# Patient Record
Sex: Male | Born: 1980 | Race: Black or African American | Hispanic: No | Marital: Single | Smoking: Never smoker
Health system: Southern US, Community
[De-identification: ages and names within clinical notes are randomized; demographics above are authoritative.]

---
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
## 2003-01-17 ENCOUNTER — Ambulatory Visit (HOSPITAL_COMMUNITY): Admission: RE | Admit: 2003-01-17 | Discharge: 2003-01-17 | Payer: Self-pay | Admitting: Family Medicine

## 2003-08-23 ENCOUNTER — Ambulatory Visit (HOSPITAL_COMMUNITY): Admission: RE | Admit: 2003-08-23 | Discharge: 2003-08-23 | Payer: Self-pay | Admitting: Family Medicine

## 2003-09-09 ENCOUNTER — Encounter (INDEPENDENT_AMBULATORY_CARE_PROVIDER_SITE_OTHER): Payer: Self-pay | Admitting: *Deleted

## 2003-09-09 ENCOUNTER — Ambulatory Visit (HOSPITAL_COMMUNITY): Admission: RE | Admit: 2003-09-09 | Discharge: 2003-09-09 | Payer: Self-pay | Admitting: Internal Medicine

## 2003-11-19 ENCOUNTER — Ambulatory Visit: Payer: Self-pay | Admitting: Family Medicine

## 2003-11-19 ENCOUNTER — Ambulatory Visit: Payer: Self-pay | Admitting: *Deleted

## 2004-01-24 ENCOUNTER — Ambulatory Visit: Payer: Self-pay | Admitting: Family Medicine

## 2004-02-20 ENCOUNTER — Ambulatory Visit: Payer: Self-pay | Admitting: Family Medicine

## 2004-03-03 ENCOUNTER — Ambulatory Visit (HOSPITAL_COMMUNITY): Admission: RE | Admit: 2004-03-03 | Discharge: 2004-03-03 | Payer: Self-pay | Admitting: Family Medicine

## 2004-12-11 IMAGING — US US BIOPSY
1 series · 9 of 9 positions shown · non-contrast
Comparison: none

CLINICAL DATA: 23 year old male with dominant nodule within the left lobe of the thyroid.  Request to perform fine needle aspiration.
 ULTRASOUND-GUIDED FINE NEEDLE ASPIRATION OF LEFT LOBE OF THYROID ? 09/09/03 
 The procedure was thoroughly discussed with the patient and questions were answered.  Risks and benefits of the procedure were also delineated.  Verbal as well as written consent was obtained.    
 Ultrasound was first performed to mark and localize an adequate site for the biopsy and this area was marked.  The patient was then prepped and draped in the normal sterile fashion and 1% lidocaine was used for local anesthesia.  Using direct ultrasound guidance, three passes were made using a 25 gauge hypodermic needle into the dominant nodule located within the left lobe of the thyroid.  Ultrasound confirmed placement of the needle on all three occasions.  Specimens were given to pathology for further analysis.  Post-procedure imaging demonstrated no immediate complication or hematoma.  
 Dr. Isaura Lala personally supervised the above procedure.  
 IMPRESSION
 Successful ultrasound-guided fine needle aspiration of the left lobe of the thyroid.  Final pathology pending.

[Series 1: unknown · 0.07mm/px · 9 of 9 slices shown]
[im 1/9]
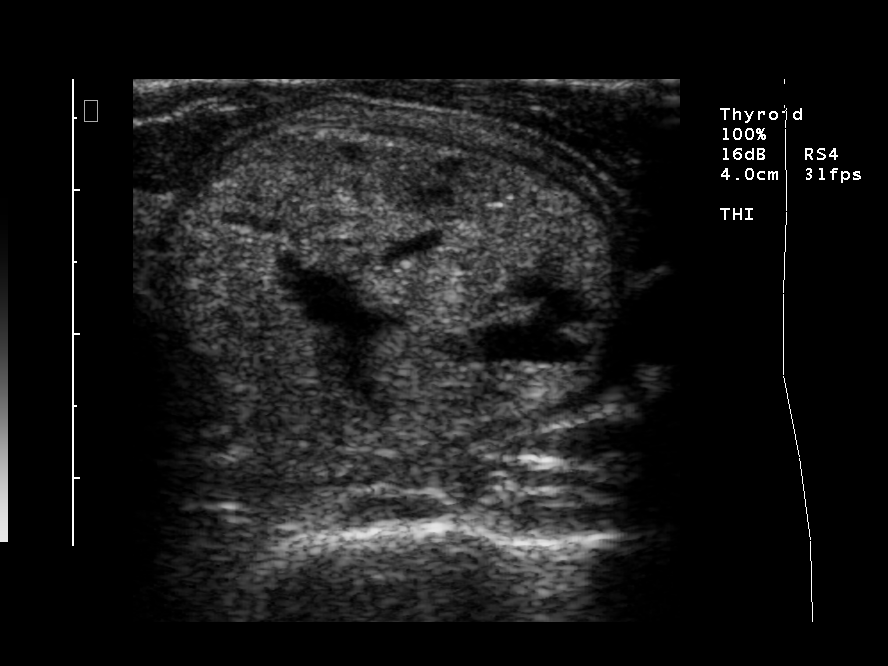
[im 2/9]
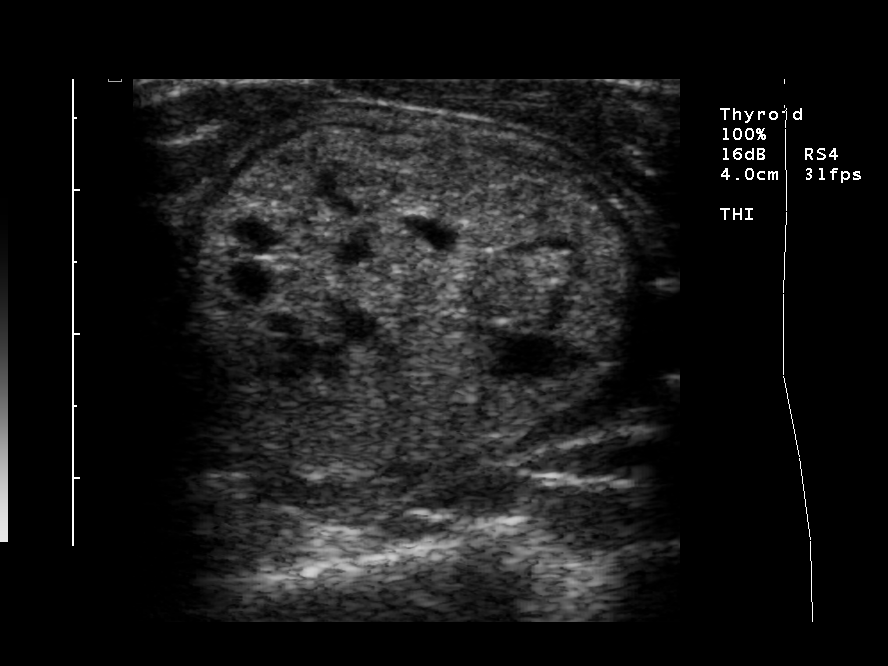
[im 3/9]
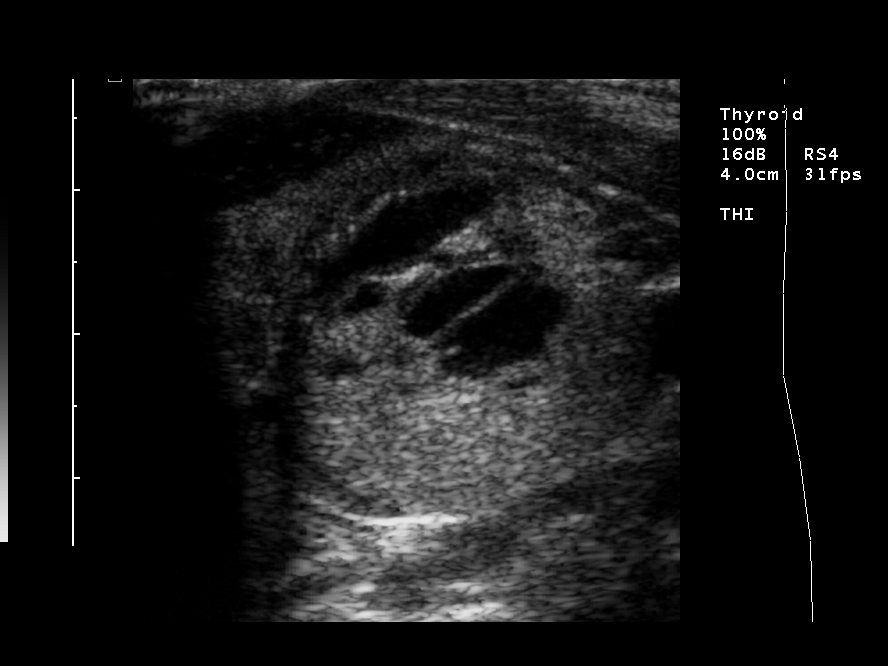
[im 4/9]
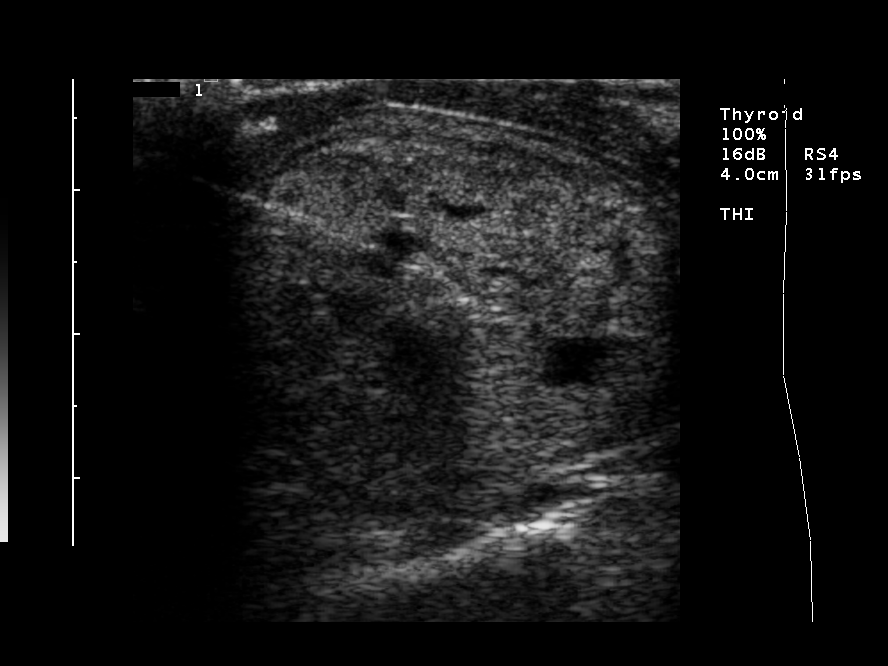
[im 5/9]
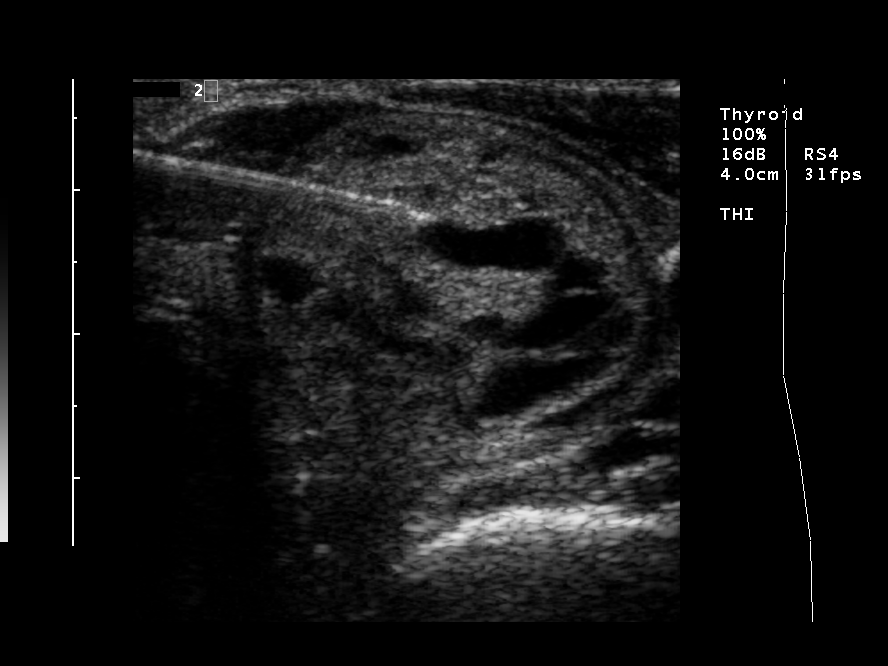
[im 6/9]
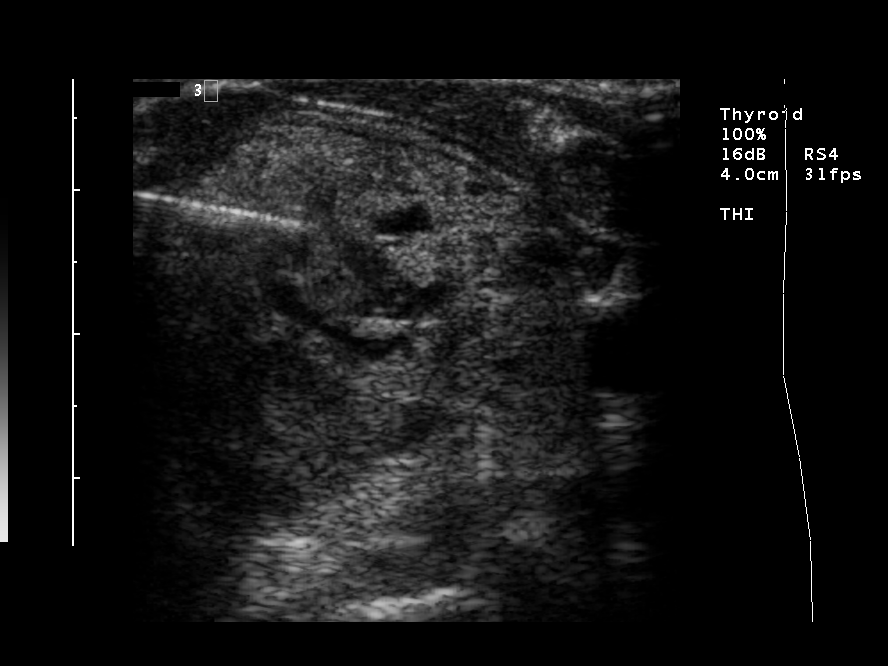
[im 7/9]
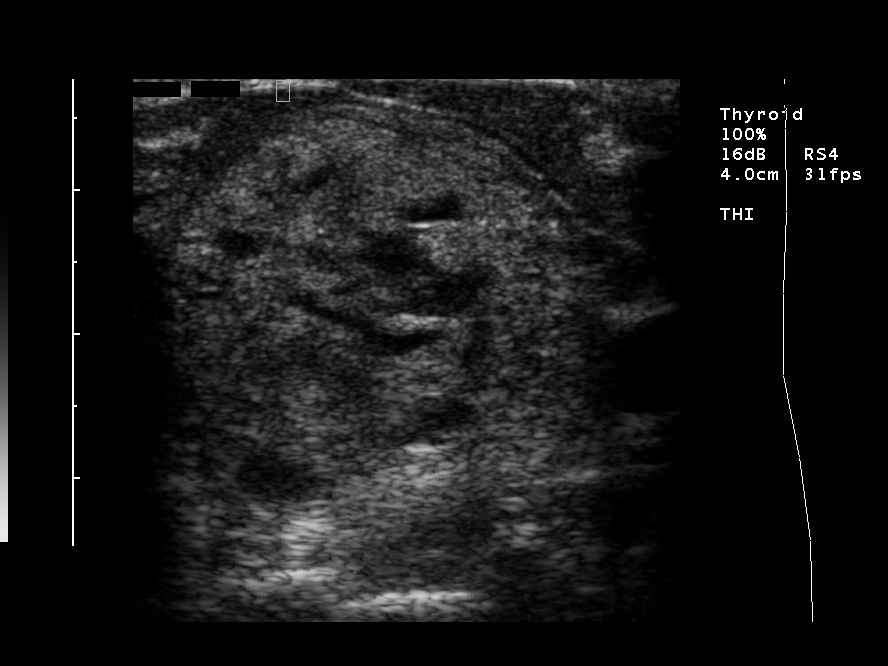
[im 8/9]
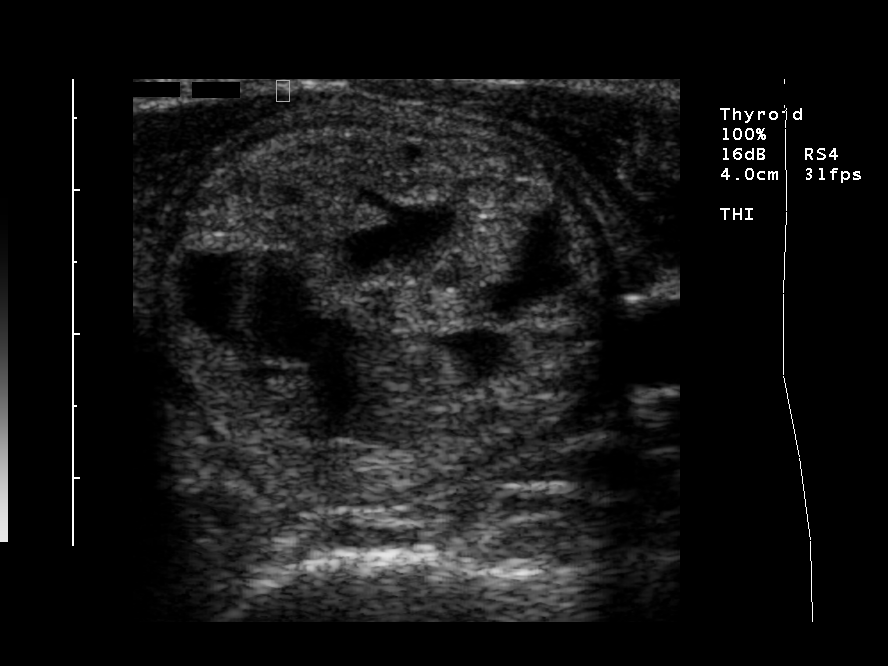
[im 9/9]
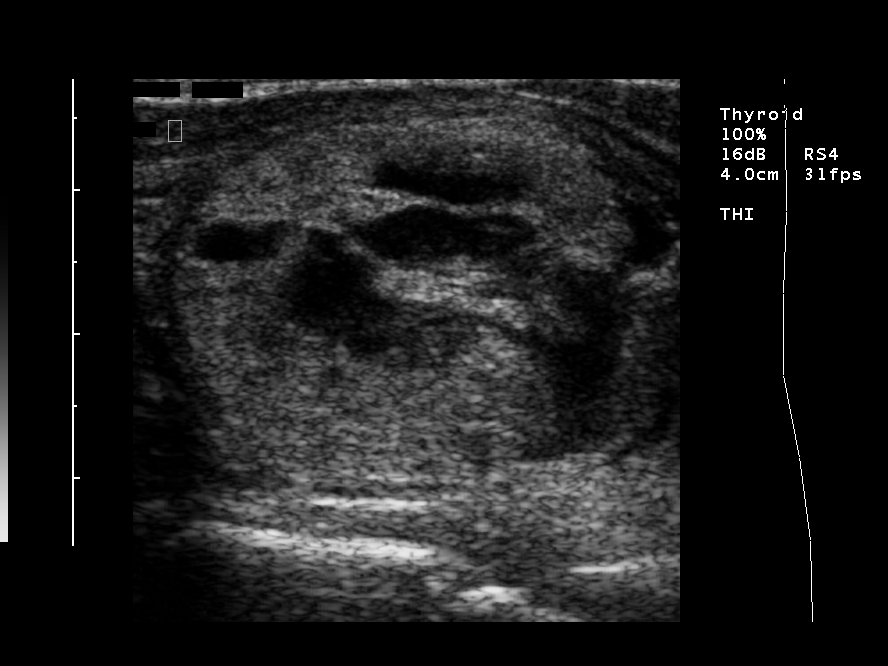

[9 of 9 positions shown; findings below may reference images not displayed]

## 2005-01-09 ENCOUNTER — Emergency Department (HOSPITAL_COMMUNITY): Admission: EM | Admit: 2005-01-09 | Discharge: 2005-01-09 | Payer: Self-pay | Admitting: Family Medicine

## 2005-02-07 ENCOUNTER — Emergency Department (HOSPITAL_COMMUNITY): Admission: EM | Admit: 2005-02-07 | Discharge: 2005-02-07 | Payer: Self-pay | Admitting: Emergency Medicine

## 2005-12-06 ENCOUNTER — Encounter (INDEPENDENT_AMBULATORY_CARE_PROVIDER_SITE_OTHER): Payer: Self-pay | Admitting: Specialist

## 2005-12-06 ENCOUNTER — Encounter: Admission: RE | Admit: 2005-12-06 | Discharge: 2005-12-06 | Payer: Self-pay | Admitting: Surgery

## 2005-12-06 ENCOUNTER — Other Ambulatory Visit: Admission: RE | Admit: 2005-12-06 | Discharge: 2005-12-06 | Payer: Self-pay | Admitting: Interventional Radiology

## 2006-04-18 ENCOUNTER — Emergency Department (HOSPITAL_COMMUNITY): Admission: EM | Admit: 2006-04-18 | Discharge: 2006-04-18 | Payer: Self-pay | Admitting: Emergency Medicine

## 2006-05-21 ENCOUNTER — Emergency Department (HOSPITAL_COMMUNITY): Admission: EM | Admit: 2006-05-21 | Discharge: 2006-05-21 | Payer: Self-pay | Admitting: Family Medicine

## 2007-02-23 ENCOUNTER — Ambulatory Visit: Payer: Self-pay | Admitting: Family Medicine

## 2007-02-23 LAB — CONVERTED CEMR LAB
ALT: 9 units/L (ref 0–53)
Alkaline Phosphatase: 65 units/L (ref 39–117)
BUN: 15 mg/dL (ref 6–23)
Basophils Absolute: 0 10*3/uL (ref 0.0–0.1)
Free T4: 1.72 ng/dL (ref 0.89–1.80)
Hemoglobin: 15.4 g/dL (ref 13.0–17.0)
Lymphocytes Relative: 35 % (ref 12–46)
Lymphs Abs: 2.1 10*3/uL (ref 0.7–4.0)
MCHC: 34.1 g/dL (ref 30.0–36.0)
Monocytes Absolute: 0.6 10*3/uL (ref 0.1–1.0)
Neutro Abs: 3.2 10*3/uL (ref 1.7–7.7)
RBC: 5.06 M/uL (ref 4.22–5.81)
RDW: 11.8 % (ref 11.5–15.5)
Sodium: 139 meq/L (ref 135–145)
T4, Total: 11 ug/dL (ref 5.0–12.5)
Total Bilirubin: 0.4 mg/dL (ref 0.3–1.2)
WBC: 5.9 10*3/uL (ref 4.0–10.5)

## 2007-03-10 IMAGING — US US BIOPSY
1 series · 9 of 9 positions shown · non-contrast
Comparison: none

CLINICAL DATA: Patient with history of multinodular goiter and a dominant mixed cystic and solid left thyroid lobe nodule previously measuring 5.2 x 2.7 x 3.6 cm on 03/03/04 thyroid ultrasound.  The patient has also undergone fine needle aspiration of this dominant nodule in [DATE] with findings suggesting a hyperplastic nodule vs. follicular lesion.  The patient presents today for follow-up imaging and fine needle aspiration of this dominant left thyroid lobe nodule. 
ULTRASOUND GUIDED FINE NEEDLE ASPIRATION, DOMINANT LEFT THYROID SOLID/CYSTIC NODULE:

[Series 1: unknown · 0.09mm/px · 9 of 9 slices shown]
[im 1/9]
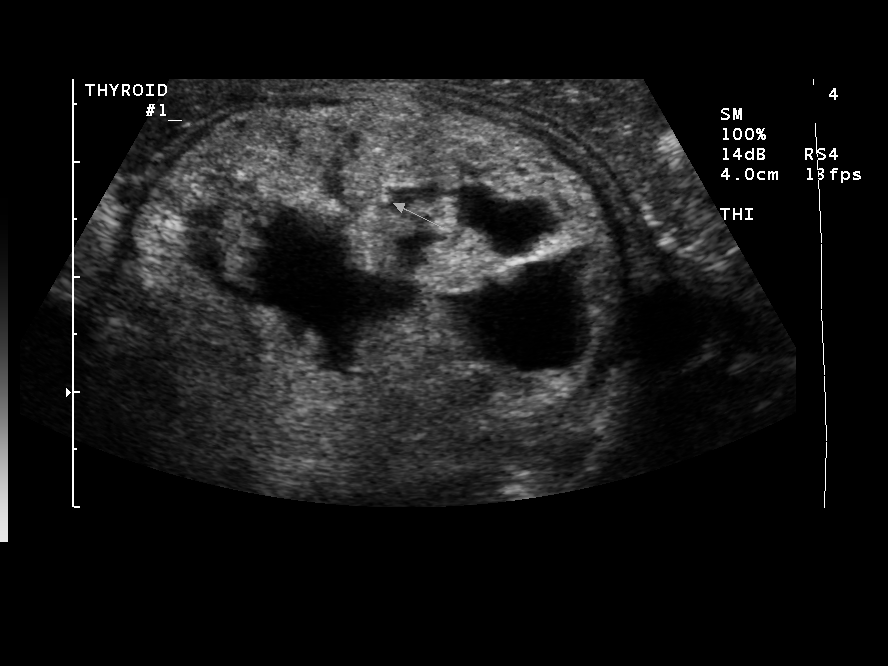
[im 2/9]
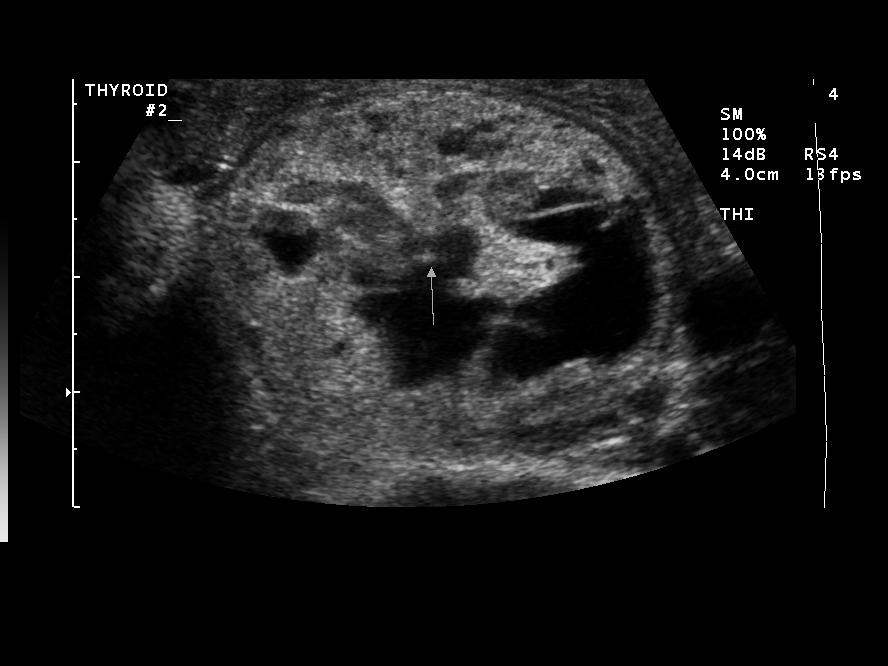
[im 3/9]
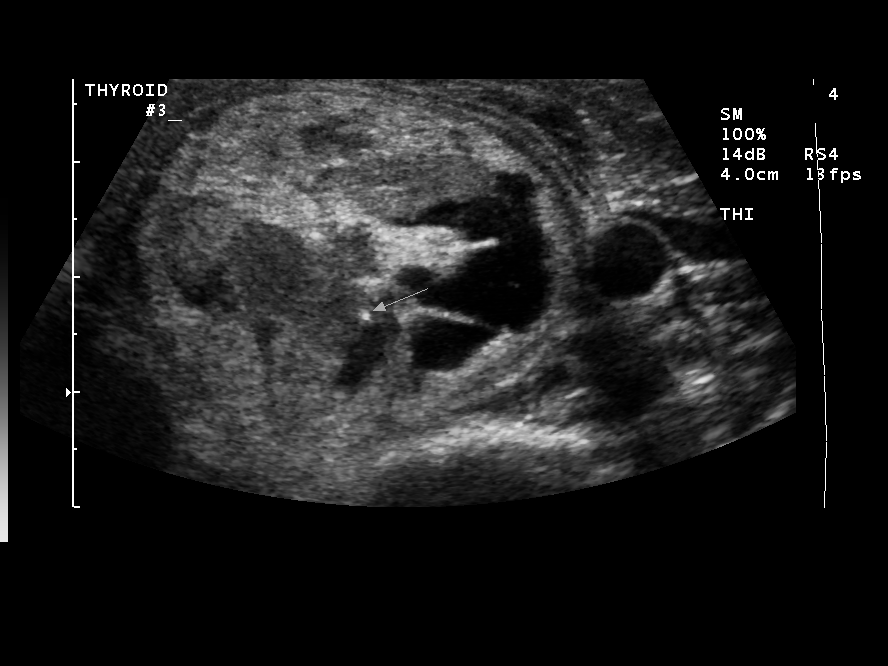
[im 4/9]
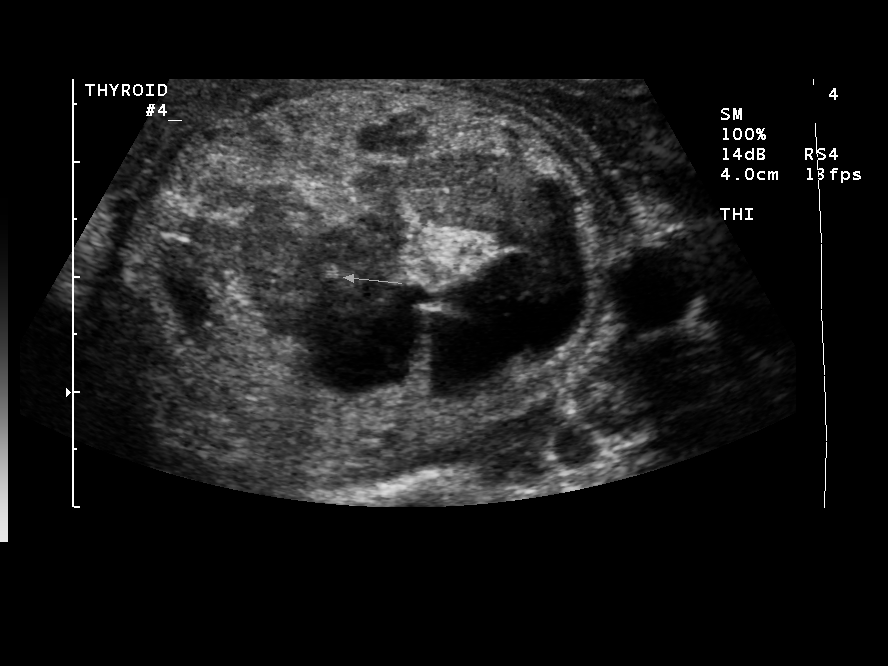
[im 5/9]
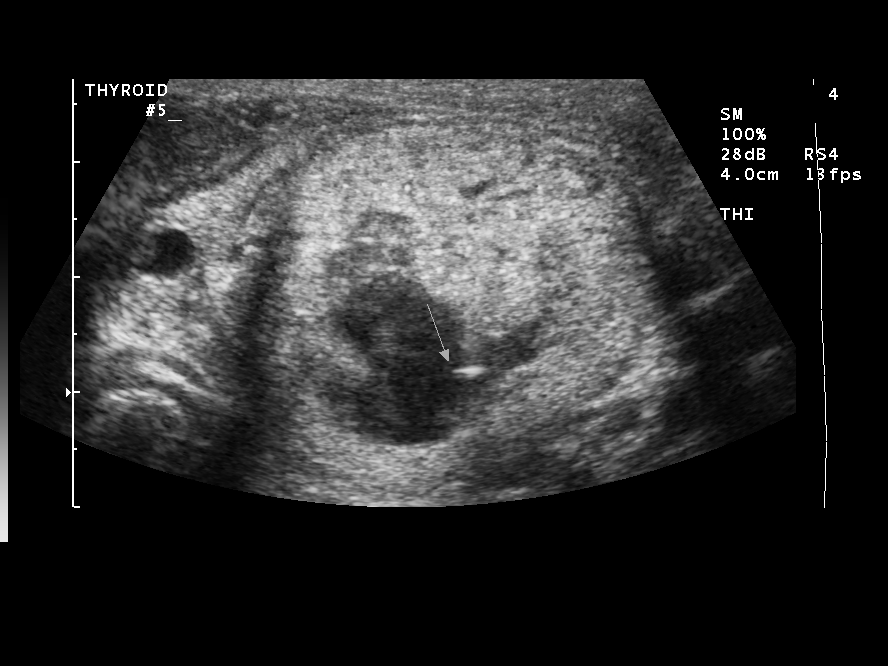
[im 6/9]
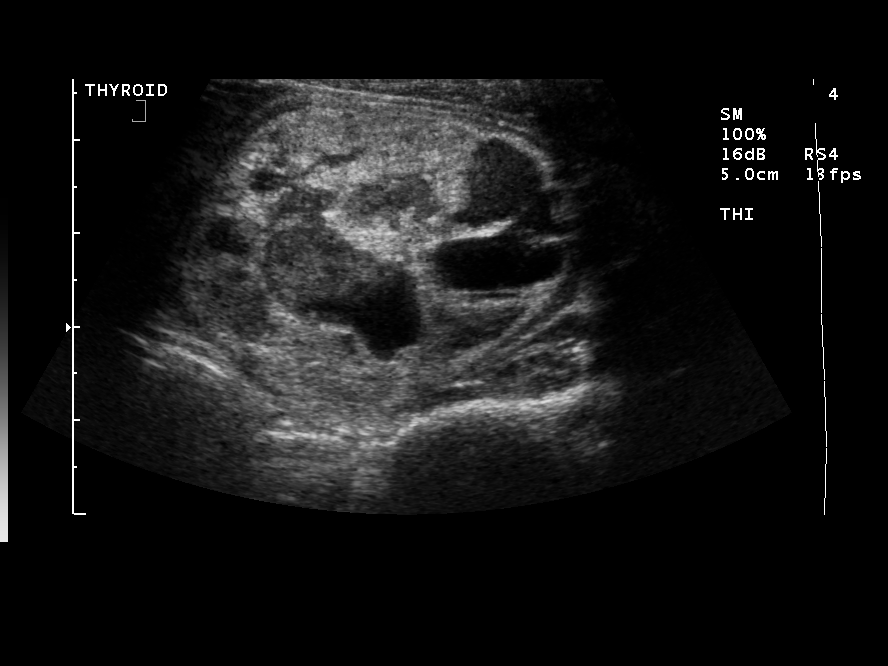
[im 7/9]
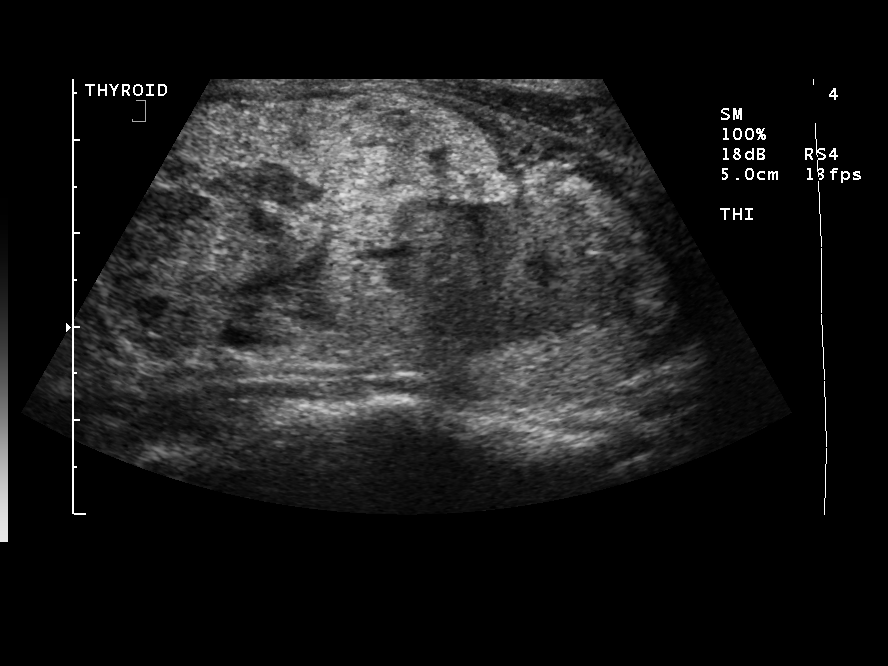
[im 8/9]
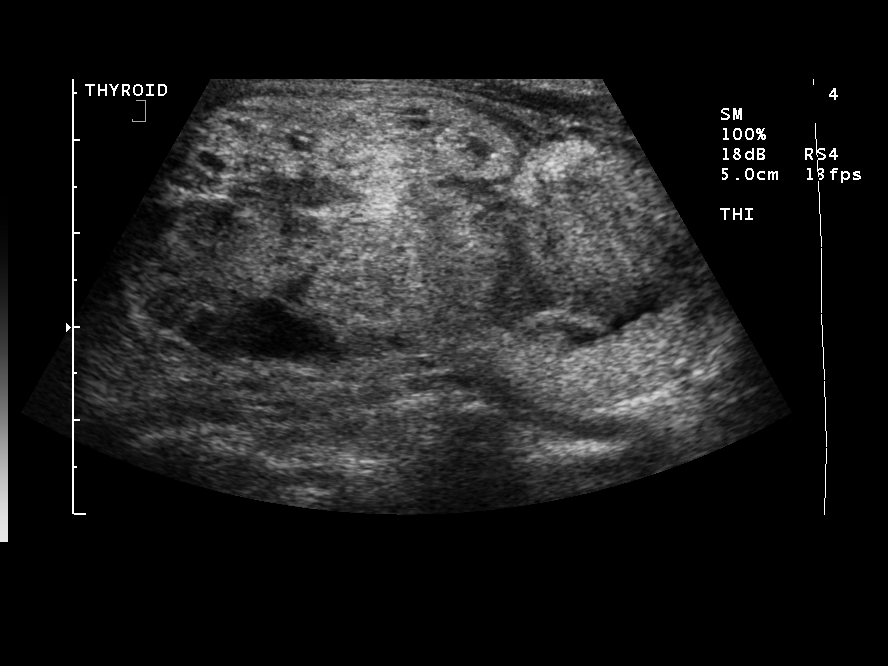
[im 9/9]
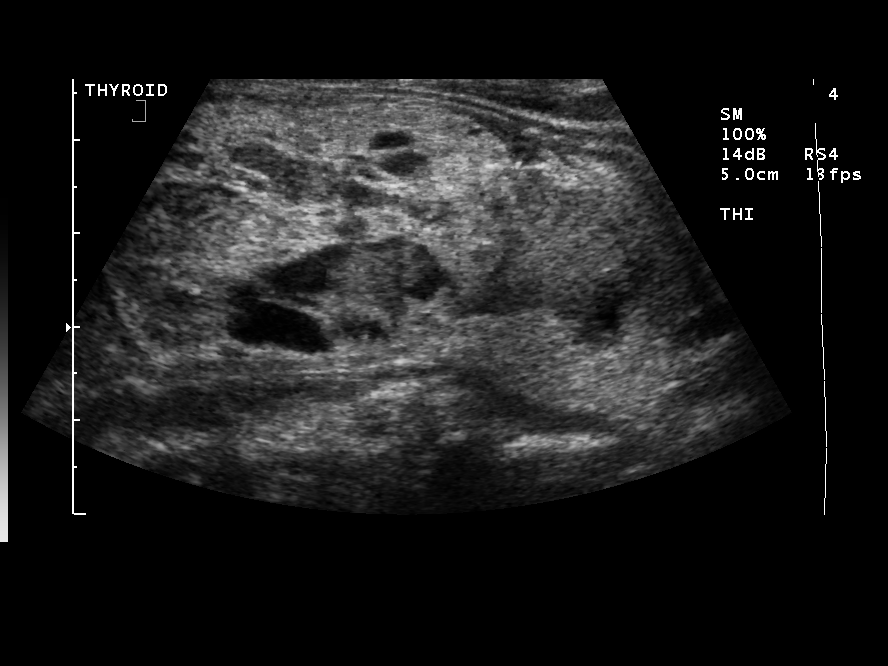

[9 of 9 positions shown; findings below may reference images not displayed]

FINDINGS: The above procedure was thoroughly discussed with the patient and written informed consent was obtained.
Since the patient?s previous thyroid ultrasound of 03/03/04, the dominant mixed solid and cystic nodule involving much of the mid and lower pole of the left thyroid lobe has increased in size, now measuring 6.5 x 3.2 x 4.2 cm (previously 5.2 x 2.7 x 3.6 cm).  
Ultrasound was then performed to localize and mark an adequate site for the biopsy.  The patient was then prepped and draped in a normal sterile fashion, and 1% Lidocaine was used for local anesthesia.  Under direct ultrasound guidance, five passes (three via 25 gauge hypodermic needles, 2 via 22 gauge hypodermic needles) were made into the dominant nodule located within the mid and lower pole of the left lobe of the thyroid.  Ultrasound confirmed placement of the needle on all five occasions.   Approximately 10 cc's of bloody fluid were removed from the dominant cystic component of the nodule.   The specimens were given to pathology for further analysis.  Post procedure imaging demonstrated no hematoma or immediate complication. The patient tolerated the procedure well.
IMPRESSION: Successful ultrasound guided fine needle aspiration, dominant nodule, mid and lower pole, left lobe of the thyroid.  Final pathology pending.

## 2007-06-16 ENCOUNTER — Ambulatory Visit: Payer: Self-pay | Admitting: Internal Medicine

## 2007-06-20 ENCOUNTER — Ambulatory Visit: Payer: Self-pay | Admitting: Internal Medicine

## 2007-10-05 ENCOUNTER — Emergency Department (HOSPITAL_COMMUNITY): Admission: EM | Admit: 2007-10-05 | Discharge: 2007-10-05 | Payer: Self-pay | Admitting: Family Medicine

## 2013-04-24 ENCOUNTER — Other Ambulatory Visit: Payer: Self-pay | Admitting: *Deleted

## 2013-05-01 ENCOUNTER — Telehealth: Payer: Self-pay | Admitting: *Deleted

## 2013-05-01 NOTE — Telephone Encounter (Signed)
Okay to refill but need to make his appointment for July for followup with labs

## 2013-05-01 NOTE — Telephone Encounter (Signed)
Patient said he saw you over a year ago about his thyroid, he's on Synthroid 112 mcg, he said at his last visit you told him you didn't need to see him again for about 2 years.  He needs a refill of this medication.   He is going to come in Friday to sign a medical release form to get his records from [facility], okay to send in a refill now?

## 2013-05-02 ENCOUNTER — Other Ambulatory Visit: Payer: Self-pay | Admitting: *Deleted

## 2013-05-02 MED ORDER — SYNTHROID 112 MCG PO TABS: 112.0000 ug | ORAL_TABLET | Freq: Every day | ORAL | Status: DC

## 2013-06-12 ENCOUNTER — Other Ambulatory Visit: Payer: Self-pay

## 2013-06-12 ENCOUNTER — Other Ambulatory Visit: Payer: Self-pay | Admitting: *Deleted

## 2013-06-12 DIAGNOSIS — E039 Hypothyroidism, unspecified: Secondary | ICD-10-CM | POA: Insufficient documentation

## 2013-06-14 ENCOUNTER — Ambulatory Visit: Payer: Self-pay | Admitting: Endocrinology

## 2013-06-14 ENCOUNTER — Other Ambulatory Visit (INDEPENDENT_AMBULATORY_CARE_PROVIDER_SITE_OTHER): Payer: Self-pay

## 2013-06-14 DIAGNOSIS — E039 Hypothyroidism, unspecified: Secondary | ICD-10-CM

## 2013-06-14 LAB — TSH: TSH: 0.8 u[IU]/mL (ref 0.35–4.50)

## 2013-06-14 LAB — T4, FREE: Free T4: 1.17 ng/dL (ref 0.60–1.60)

## 2013-07-11 ENCOUNTER — Ambulatory Visit: Payer: Self-pay | Admitting: Endocrinology

## 2013-07-20 ENCOUNTER — Ambulatory Visit: Payer: Self-pay | Admitting: Endocrinology

## 2013-10-10 ENCOUNTER — Telehealth: Payer: Self-pay | Admitting: Endocrinology

## 2013-10-10 NOTE — Telephone Encounter (Signed)
Pt wants Bradley Paul to call pt. He does not want to have his labs drawn his level has never changed at his appt on 11/08/13 please discuss with Dr. Lucianne Muss and let him know if Dr. Lucianne Muss is ok with that

## 2013-10-10 NOTE — Telephone Encounter (Signed)
Please see below and advise.

## 2013-10-10 NOTE — Telephone Encounter (Signed)
Did not understand what he is saying. If he wants to do the labs on the day of the visit that is okay

## 2013-10-16 ENCOUNTER — Other Ambulatory Visit: Payer: Self-pay | Admitting: Endocrinology

## 2013-11-08 ENCOUNTER — Ambulatory Visit (INDEPENDENT_AMBULATORY_CARE_PROVIDER_SITE_OTHER): Payer: Self-pay | Admitting: Endocrinology

## 2013-11-08 ENCOUNTER — Encounter: Payer: Self-pay | Admitting: Endocrinology

## 2013-11-08 VITALS — BP 115/75 | HR 60 | Temp 97.6°F | Resp 14 | Ht 69.0 in | Wt 149.6 lb

## 2013-11-08 DIAGNOSIS — E890 Postprocedural hypothyroidism: Secondary | ICD-10-CM

## 2013-11-08 NOTE — Progress Notes (Signed)
Patient ID: Bradley EllisAhmed M Sheikh-Issa, male   DOB: 03/29/1980, 33 y.o.   MRN: [id number]   Reason for Appointment:  Hypothyroidism, followup visit    History of Present Illness:   The hypothyroidism was first diagnosed in 2008 after total thyroidectomy for multinodular goiter  The patient has been treated with brand name Synthroid and has been taking the same dose of 112 mcg since 01/2007 The last visit was in 06/2012 when his TSH was 0.59 but he was subjectively doing well   Patient has no complaints of unusual fatigue, cold sensitivity, dry skin, unusual weight gain He says his weight fluctuates based on his appetite and is only 2 pounds below his last year weight           The patient is taking the thyroid supplement very regularly in the morning before breakfast.  Not taking any calcium or iron supplements with the thyroid supplement.   His dose was continued on change on his last visit   No visits with results within 1 Week(s) from this visit. Latest known visit with results is:  Appointment on 06/14/2013  Component Date Value Ref Range Status  . TSH 06/14/2013 0.80  0.35 - 4.50 uIU/mL Final  . Free T4 06/14/2013 1.17  0.60 - 1.60 ng/dL Final      Medication List       This list is accurate as of: 11/08/13  3:55 PM.  Always use your most recent med list.               loratadine 10 MG tablet  Commonly known as:  CLARITIN  Take 10 mg by mouth daily.     SYNTHROID 112 MCG tablet  Generic drug:  levothyroxine  TAKE 1 TABLET (112 MCG TOTAL) BY MOUTH DAILY BEFORE BREAKFAST.        Allergies:  Allergies  Allergen Reactions  . Aspirin     Kidney removed, advised not to take.  . Ibuprofen     Kidney removed, advised not to take    No past medical history on file.  No past surgical history on file.  No family history on file.  Social History:  reports that he has never smoked. He has never used smokeless tobacco. His alcohol and drug histories are not on  file.  REVIEW Of SYSTEMS:    Examination:   BP 115/75  Pulse 60  Temp(Src) 97.6 F (36.4 C)  Resp 14  Ht 5\' 9"  (1.753 m)  Wt 149 lb 9.6 oz (67.858 kg)  BMI 22.08 kg/m2  SpO2 99%  GENERAL APPEARANCE: No puffiness of the face or eyes  NECK: Thyroid is not palpable           NEUROLOGIC EXAM:  biceps reflexes show normal relaxation Skin: Not unusual dry    Assessment   Hypothyroidism, postsurgical with stable thyroid levels, TSH not as low as in 2014   Treatment:   Continue same dosage before breakfast daily.   Followup in 18 months  Matthieu Loftus 11/08/2013, 3:55 PM

## 2013-11-08 NOTE — Patient Instructions (Signed)
Same dose 

## 2013-11-09 ENCOUNTER — Other Ambulatory Visit: Payer: Self-pay | Admitting: Endocrinology

## 2013-11-09 DIAGNOSIS — E890 Postprocedural hypothyroidism: Secondary | ICD-10-CM | POA: Insufficient documentation

## 2013-11-15 ENCOUNTER — Encounter: Payer: Self-pay | Admitting: Endocrinology

## 2014-08-19 ENCOUNTER — Other Ambulatory Visit: Payer: Self-pay | Admitting: Endocrinology

## 2015-04-27 ENCOUNTER — Other Ambulatory Visit: Payer: Self-pay | Admitting: Endocrinology

## 2015-06-21 ENCOUNTER — Other Ambulatory Visit: Payer: Self-pay | Admitting: Endocrinology
